# Patient Record
Sex: Male | Born: 1960 | Race: White | Hispanic: No | Marital: Single
Health system: Southern US, Community
[De-identification: ages and names within clinical notes are randomized; demographics above are authoritative.]

---

## 2012-04-14 ENCOUNTER — Emergency Department: Payer: Self-pay | Admitting: Emergency Medicine

## 2012-04-15 ENCOUNTER — Emergency Department: Payer: Self-pay | Admitting: Emergency Medicine

## 2013-12-30 IMAGING — CR RIGHT FOOT COMPLETE - 3+ VIEW
1 series · 3 of 3 positions shown · non-contrast
Comparison: none

REASON FOR EXAM: crush injury
COMMENTS:

PROCEDURE:     DXR - DXR FOOT RT COMPLETE W/OBLIQUES  - April 14, 2012  [DATE]
RESULT:     Comparison:  None

[Series 1: ap · 0.17mm/px · 3 of 3 slices shown]
[im 1/3]
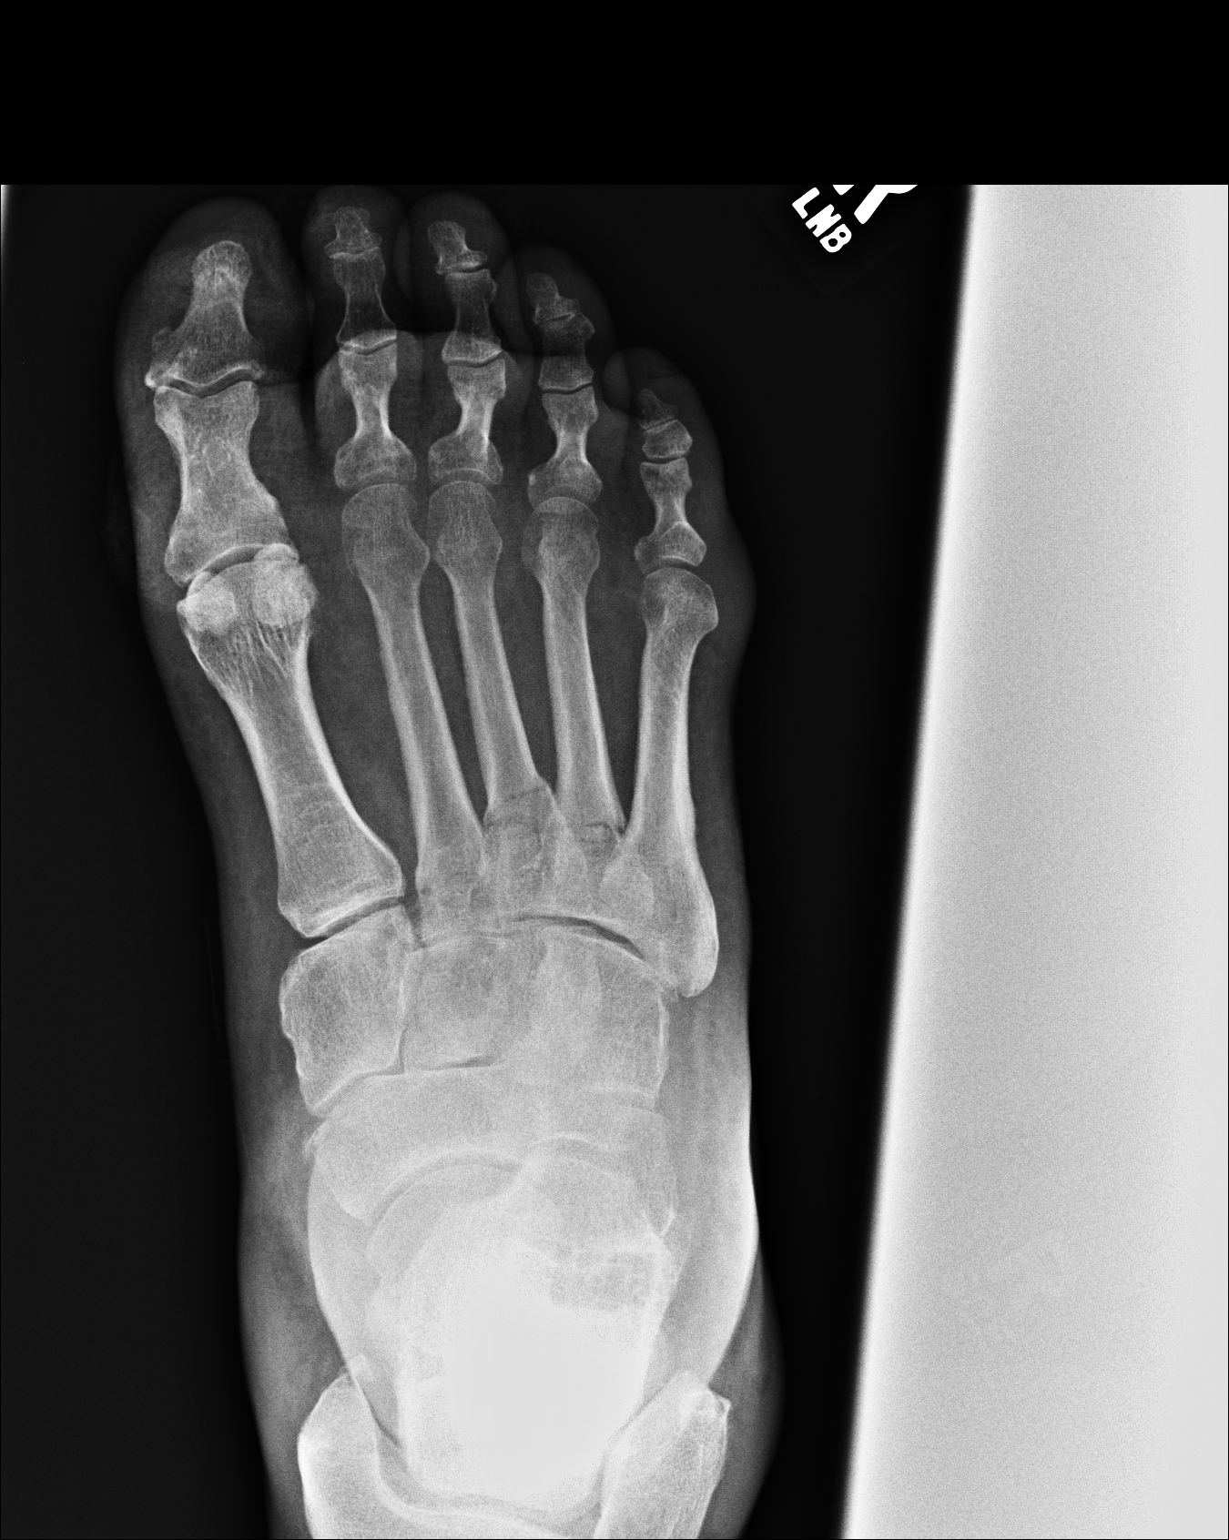
[im 2/3]
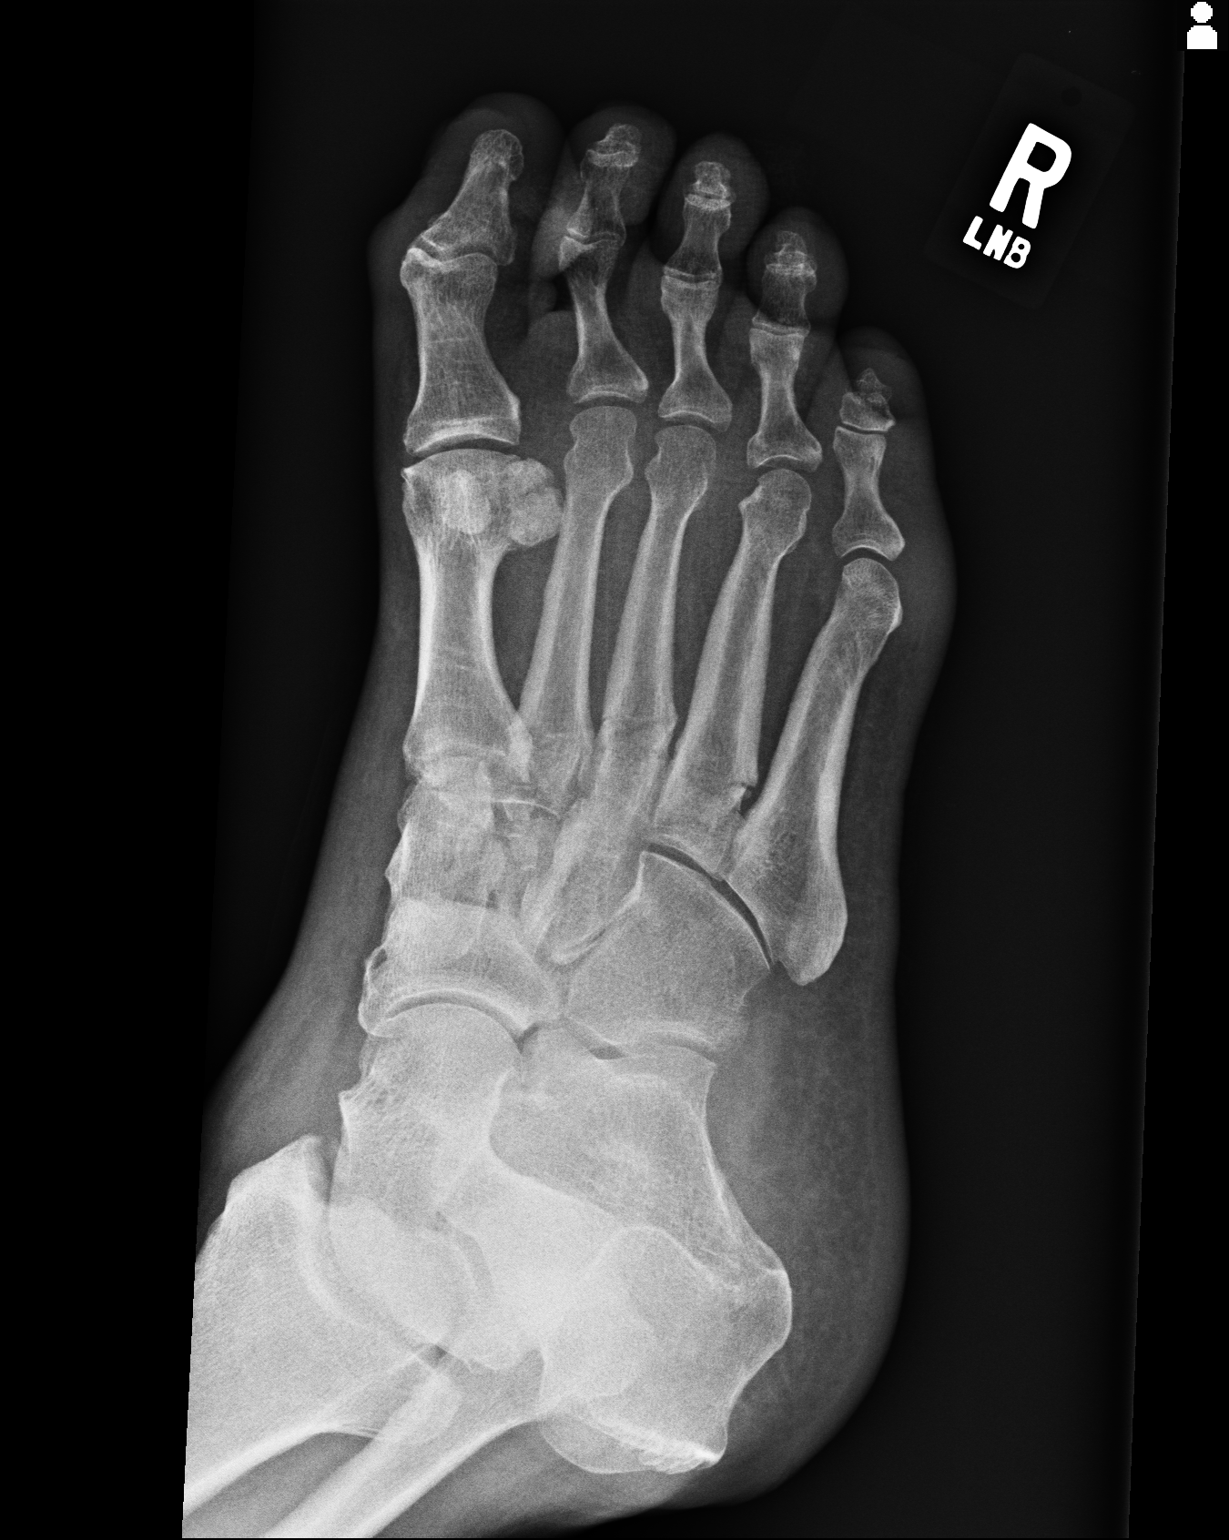
[im 3/3]
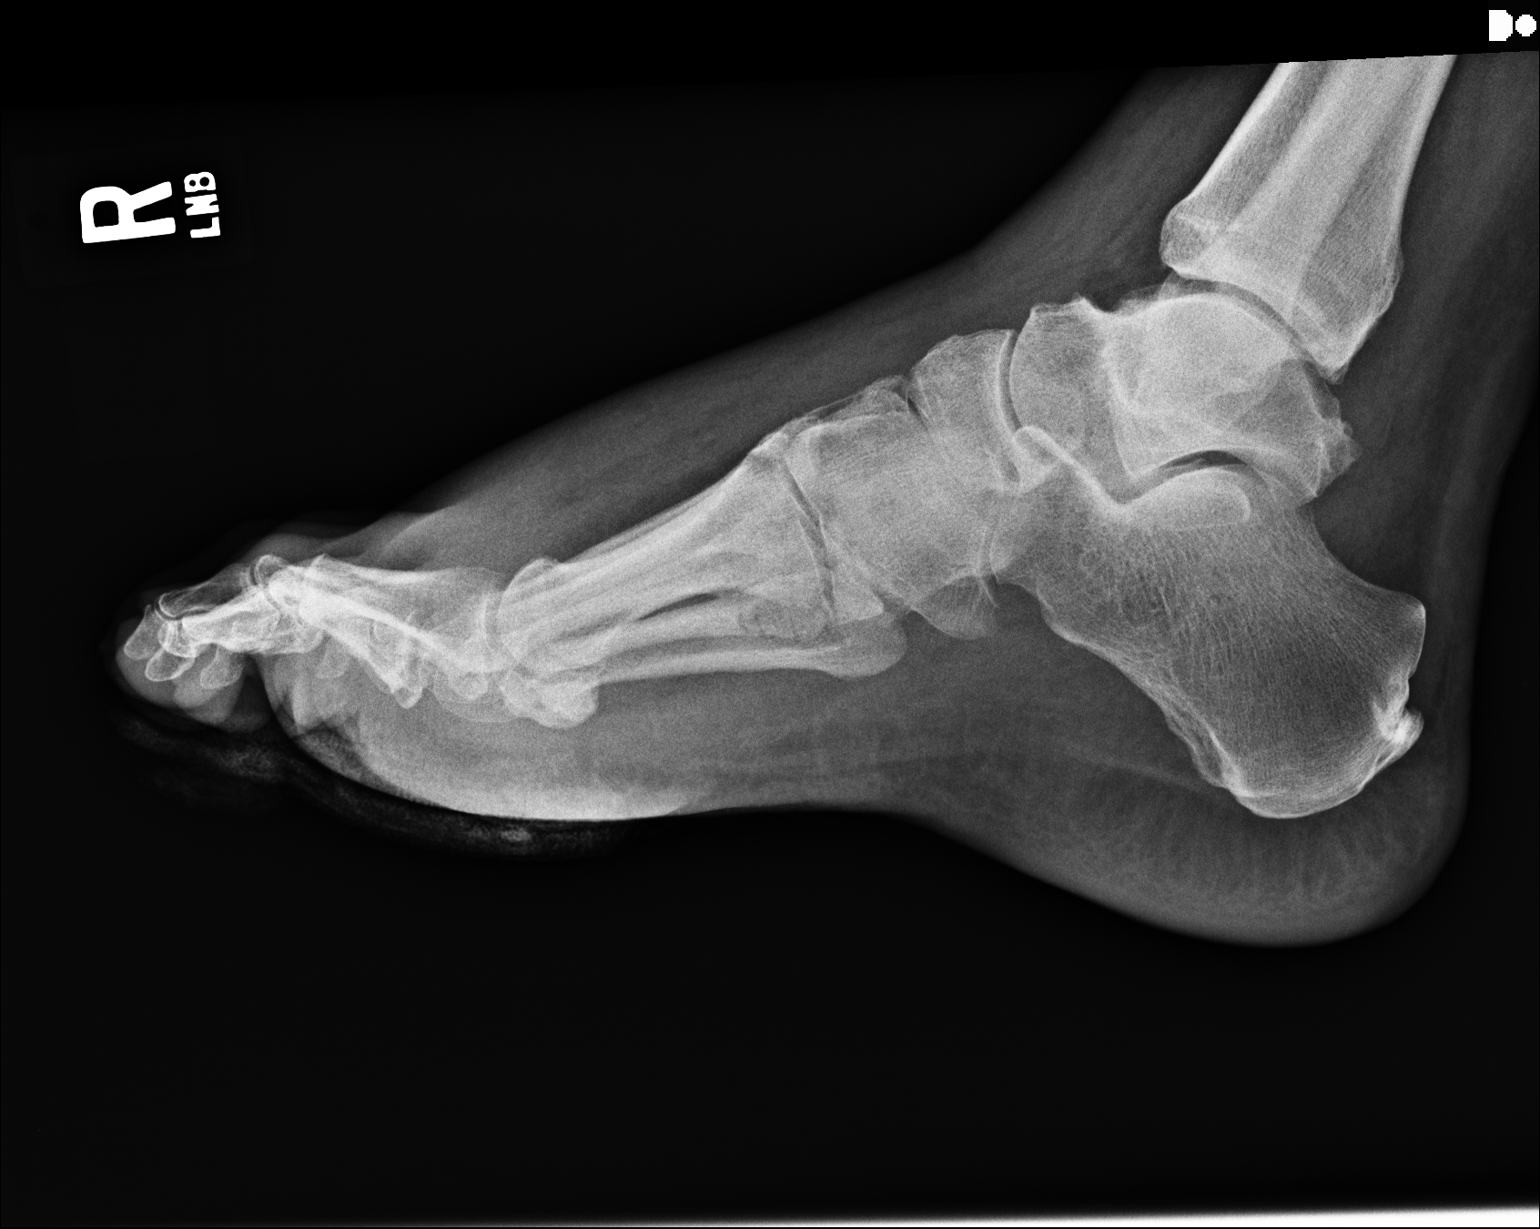

[3 of 3 positions shown; findings below may reference images not displayed]

FINDINGS: AP, oblique, and lateral views of the right foot demonstrates nondisplaced
fractures of the proximal shaft of the third and fourth metatarsals. There
is no displacement. There is no angulation. There is a bipartite lateral
hallux sesamoid. There is soft tissue swelling overlying the right foot.
IMPRESSION: Nondisplaced fractures of the proximal shaft of the third and fourth right
metatarsals.

[REDACTED]

## 2014-11-05 NOTE — Consult Note (Signed)
PATIENT NAME:  Bruce Fowler, Bruce Fowler MR#:  [id number] DATE OF BIRTH:  [birth year]  DATE OF CONSULTATION:  04/15/2012  REFERRING PHYSICIAN:  Dorothea Glassman, MD  CONSULTING PHYSICIAN:  Bruce Devoid, MD  REASON FOR CONSULTATION: Right foot pain and swelling.   HISTORY OF PRESENT ILLNESS: Bruce Fowler is a 54 year old male who initially presented to the Emergency Room yesterday, 04/14/2012, after an injury to his right foot. The patient explained to the Emergency Department staff that at approximately 4:30 the patient had the tongue of a trailer land on the top of his right foot. He presented to the Emergency Room with pain and swelling with some superficial abrasions. He was told to follow-up for wound check today. Upon presentation to the Emergency Room, Dr. Darnelle Fowler asked for orthopedic consultation.   Today the patient states that he was placed in a posterior splint. He has had swelling but states he has been elevating the right foot. His pain is manageable. He is on chronic pain medications for a chronic back issue. He is followed by a Pain [facility] in [place]. The patient takes oxycodone 1 tab every 3 to 4 hours per his pain management instructions for chronic back pain. He states he is still having some pain even on this dose and questions whether he can take more. 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 The patient states he is able to bear weight on the right lower extremity when he places weight only on his heel. He is seen in the clinic today with his daughter.   PAST MEDICAL HISTORY: Reviewed from the ER chart.   PHYSICAL EXAMINATION:  RIGHT FOOT: The patient has a diffusely swollen right foot. His compartments, however, are compressible. He has swelling in all five toes and the dorsum of his foot. Over the dorsal lateral foot covering the third fifth metatarsals he has an area of ecchymosis. There are superficial fracture blisters over the dorsum  of his foot as well. Fracture blisters are developing on the dorsum of his toes as well. There are areas of ecchymosis but no erythema. The patient has a scant amount of bloody drainage from the medial side of his hallux near the end of the nail plate. The patient has baseline sensation to light touch due to his neuropathy. He does not have any further loss of sensation and can feel me touching his foot. He has no swelling extending into the lower leg and has no calf tenderness. His foot is well perfused and warm to touch. He has no pain with passive stretch of all five digits. His foot compartments again are compressible.   RADIOLOGY: I reviewed the foot films taken yesterday in the ED. These show nondisplaced fractures of the base of the third and fourth metatarsals. There is no displacement of these fractures. No other fractures are noted. There are no dislocations. He has a bone spur throughout the posterior aspect of the calcaneus and some early degenerative changes in the mid foot. The patient has a well maintained arch. There is soft tissue shadow seen throughout his x-ray particularly evident on the lateral view.   ASSESSMENT: Right foot metatarsal fractures with soft tissue crush injury without evidence of compartment syndrome.   PLAN: I explained to Bruce Fowler that he needs to perform strict elevation of his right foot. I instructed him not to weight-bear on the right lower extremity. He does have crutches and he is to use them  at all times when he is upright. However, I told him to go home and elevate his right lower extremity to a level above his heart. I encouraged him to pack the right foot in ice to help reduce the swelling. I feel given the acute injury he has sustained he could increase his oxycodone dose to 1 to 2 tablets every 3 to 4 hours as needed for pain and alternate that with Tylenol. He will contact his pain management provider on Monday regarding more narcotic medications and to  inform them of this change in how he is taking the oxycodone. The patient was instructed to return to the Emergency Room immediately if he had any worsening of swelling, decreased sensation, inability to move his toes, or any purulent drainage from his abrasions. He will also keep a close eye on the fracture blistering and he is to return immediately if there is any signs of necrosis of the skin. The patient will be given my office information to call with any additional questions or concerns. I would like to see him back in the office in one week. The patient is hesitant to want to return to my office as he states he has no insurance and pays his medical bills independently. He states that his girlfriend is an [employer] fund[employer] and will watch after the foot. However, I reinforced to him the importance of follow-up to ensure that he does not develop an infection or necrosis and to ensure that the swelling improves and to ensure there is no displacement of the fracture. He will continue with a compressive wrap and a posterior splint and will pack his foot in ice and elevate. The patient understood and agreed with this plan. The patient was seen with Dr. Darnelle CatalanMalinda at the bedside who was present for our entire examination and discussion.   ____________________________ Bruce DevoidKevin L. Landyn Buckalew, MD [id number] D: 04/15/2012 15:17:53 ET T: 04/15/2012 15:35:41 ET JOB#: [id number]  cc: Bruce DevoidKevin L. Odus Clasby, MD, <Dictator> Bruce DevoidKEVIN L Fahim Kats MD ELECTRONICALLY SIGNED 04/21/2012 13:51

## 2014-11-05 NOTE — Consult Note (Signed)
Brief Consult Note: Diagnosis: Right foot swelling with 3rd and 4th MT fractures.   Patient was seen by consultant.   Consult note dictated.   Discussed with Attending MD.   Comments: Patient seen and evaluated with Dr. Dorothea GlassmanPaul Malinda from the ED.  Patient has significant swelling with fracture blisters of the right foot after sustaining a crush injury to the right foot with fractures of the 3rd and 4th metatarsals.  Despite the swelling, there are no signs of a compartment syndrome on exam.  There are no signs of infection.  Patient was instructed on the importance of strict elevation of the right lower extremity above his heart at all times throughout the weekend.  He will continue his posterior splint and compressive dressing and apply ice to the right foot throughout the weekend.  He was instructed on signs and symptoms of infection and compartment syndrome and the importance of returning immediately to the ER should he experience these signs or symptoms.  He will follow up in my office in one week.  Electronic Signatures: Juanell FairlyKrasinski, Lenox Ladouceur (MD)  (Signed 28-Sep-13 15:22)  Authored: Brief Consult Note   Last Updated: 28-Sep-13 15:22 by Juanell FairlyKrasinski, Rella Egelston (MD)
